# Patient Record
Sex: Male | Born: 1995 | Race: White | Hispanic: No | Marital: Single | Smoking: Former smoker
Health system: Southern US, Community
[De-identification: ages and names within clinical notes are randomized; demographics above are authoritative.]

---

## 2020-03-07 ENCOUNTER — Telehealth: Payer: Self-pay | Admitting: Psychiatry

## 2020-03-07 NOTE — Telephone Encounter (Signed)
Patients father called and stated that Travis Clay has been very depressed lately. He has an upcoming appt for 03/29/2020 but his father is concered about him and wants to know if he can get in this week. I let him know that Dr.Cottles's schedule is full for this week and that I would reach out to Dr. Jennelle Human to see if he recommends anything the patient can do until his upcoming appt. Travis Clay also states that they are keeping a close eye o him since his depression has gotten worse.

## 2020-03-29 ENCOUNTER — Ambulatory Visit (INDEPENDENT_AMBULATORY_CARE_PROVIDER_SITE_OTHER): Payer: Self-pay | Admitting: Psychiatry

## 2020-03-29 ENCOUNTER — Encounter: Payer: Self-pay | Admitting: Psychiatry

## 2020-03-29 ENCOUNTER — Other Ambulatory Visit: Payer: Self-pay

## 2020-03-29 ENCOUNTER — Encounter (INDEPENDENT_AMBULATORY_CARE_PROVIDER_SITE_OTHER): Payer: Self-pay

## 2020-03-29 DIAGNOSIS — F322 Major depressive disorder, single episode, severe without psychotic features: Secondary | ICD-10-CM

## 2020-03-29 MED ORDER — ARIPIPRAZOLE 5 MG PO TABS: 5.0000 mg | ORAL_TABLET | Freq: Every day | ORAL | 1 refills | Status: DC

## 2020-03-29 MED ORDER — PAROXETINE HCL 20 MG PO TABS: 20.0000 mg | ORAL_TABLET | Freq: Every day | ORAL | 1 refills | Status: DC

## 2020-03-29 MED ORDER — CLONAZEPAM 0.5 MG PO TABS
ORAL_TABLET | ORAL | 0 refills | Status: DC
Start: 2020-03-29 — End: 2020-04-15

## 2020-03-29 NOTE — Progress Notes (Signed)
[facility] MD/PA/NP Initial Note  03/29/2020 8:30 PM Travis Clay  MRN:  [id number]  Chief Complaint:   HPI: Seen with father Travis Clay "Travis Clay". Quit job in [employer] and then to [employer] job.  Couldn't sleep well obsessing over missing [place] job  Parents concerns:  Issues with boss in [place] and he was having anxiety and calling home over it.  Not feeling himself and having dark thoughts and depressed.  Was in such a crisis Dad went out there to see him.  Having problems with roommate there in [place] who was irresponsible.  Travis Clay wanted out of the situation.   Took job to [place].  But couldn't let it go about [place].  Ruminating and not sleeping.  Mother then went out to [place] concerned over his safety.   Came home 6 weeks ago and feels worse.  They're concerned he's anxious and depressed and shakey with tics.  Given him clonazepam occ for shaking.  He's flat and depressed and no joy.  Pt admits he's depressed and has had SI.  Worried over his future job insecurity bc  He's seen slow progression of decline.  Had misgivings about [place] bc maybe not a good  Place to be longterm.  Dog died.  Saw doctor and rx Lexapro 10.  Took it right before move to [place] and took it 2-3 mos.  Seemed to kill his drive.  Worried over warnings about SI. No benefit. Since coming to [place] no better worrying over future and feels regret over his decisions.  A lot of anger towards family bc being pushy about his decisions.   Seen Travis Clay saying he has had recent hair pulling from beard.   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  More "serious" SI recently.  Depressed and don't want to get out of bed in AM.   Only peace is watching TV.  EFA ruminating over his regrets with job.  Only couple of hours of sleep per night for a few weeks.   Starting to get scared of himself with the shakes and mild panic attacks and afraid to fly.   No enjoyment.  Difficulty concentrating.  Can't sit still.  Tired and anxious.    Been down before but never this bad.  No history of SI until [place]. No SA.  Started stuttering from loss of self confidence.   Used to be Education officer, community and outgoing.  Another doctor Rx paroxetine 10 mg for a week.  No SE noted. Melatonin and Benadryl without help.  No pot.  No history of drugs. Heavily using nicotine pouches seems to help some.  Beer or 2 every couple of days.  Visit Diagnosis:    ICD-10-CM   1. Severe major depression, single episode (HCC)  F32.2     Past Psychiatric History: As noted.   Lexapro 10 NR Paroxetine 10   Past Medical History: History reviewed. No pertinent past medical history. none  Family Psychiatric History: F his depression with positive response to Paxil and Wellbutrin B with istory of depression with treatment.  Family History: History reviewed. No pertinent family history.  Social History: Quit his last job.  He is single.  No children. Social History   Socioeconomic History  . Marital status: Single    Spouse name: Not on file  . Number of children: Not on file  . Years of education: Not on file  . Highest education level: Not on file  Occupational History  . Not  on file  Tobacco Use  . Smoking status: Not on file  Substance and Sexual Activity  . Alcohol use: Not on file  . Drug use: Not on file  . Sexual activity: Not on file  Other Topics Concern  . Not on file  Social History Narrative  . Not on file   Social Determinants of Health   Financial Resource Strain:   . Difficulty of Paying Living Expenses:   Food Insecurity:   . Worried About Charity fundraiser in the Last Year:   . Arboriculturist in the Last Year:   Transportation Needs:   . Film/video editor (Medical):   [name] Lack of Transportation (Non-Medical):   Physical Activity:   . Days of Exercise per Week:   . Minutes of Exercise per Session:   Stress:   . Feeling of Stress :   Social Connections:   . Frequency of Communication with Friends and  Family:   . Frequency of Social Gatherings with Friends and Family:   . Attends Religious Services:   . Active Member of Clubs or Organizations:   . Attends Archivist Meetings:   [name] Marital Status:     Allergies: Not on File  Metabolic Disorder Labs: No results found for: HGBA1C, MPG No results found for: PROLACTIN No results found for: CHOL, TRIG, HDL, CHOLHDL, VLDL, LDLCALC No results found for: TSH  Therapeutic Level Labs: No results found for: LITHIUM No results found for: VALPROATE No components found for:  CBMZ  Current Medications: Current Outpatient Medications  Medication Sig Dispense Refill  . PARoxetine (PAXIL) 10 MG tablet Take 10 mg by mouth daily.    . ARIPiprazole (ABILIFY) 5 MG tablet Take 1 tablet (5 mg total) by mouth daily. 30 tablet 1  . clonazePAM (KLONOPIN) 0.5 MG tablet 1-2 tablets at night for sleep and 1 twice daily as needed for anxiety 30 tablet 0  . PARoxetine (PAXIL) 20 MG tablet Take 1 tablet (20 mg total) by mouth daily. 30 tablet 1   No current facility-administered medications for this visit.    Medication Side Effects: none  Orders placed this visit:  No orders of the defined types were placed in this encounter.   Psychiatric Specialty Exam:  Review of Systems  Constitutional: Positive for fatigue. Negative for appetite change.  HENT: Negative for congestion, hearing loss, sinus pain, sore throat and tinnitus.   Eyes: Negative for visual disturbance.  Respiratory: Negative for chest tightness and shortness of breath.   Cardiovascular: Negative for chest pain and palpitations.  Gastrointestinal: Negative for abdominal distention, abdominal pain, constipation and diarrhea.  Endocrine: Negative for polyphagia.  Genitourinary: Negative for difficulty urinating, frequency and testicular pain.  Musculoskeletal: Negative for arthralgias, back pain and gait problem.  Skin: Negative for rash.  Allergic/Immunologic: Negative for  environmental allergies.  Neurological: Negative for dizziness, tremors and weakness.    There were no vitals taken for this visit.There is no height or weight on file to calculate BMI.  General Appearance: Casual  Eye Contact:  Good  Speech:  Normal Rate  Volume:  Normal  Mood:  Depressed  Affect:  Constricted and Depressed  Thought Process:  Coherent and Descriptions of Associations: Intact  Orientation:  Full (Time, Place, and Person)  Thought Content: rumination  Suicidal Thoughts:  Yes.  without intent/plan  Homicidal Thoughts:  No  Memory:  WNL  Judgement:  Fair  Insight:  Fair  Psychomotor Activity:  Normal  Concentration:  Concentration: Fair  Recall:  Good  Fund of Knowledge: Good  Language: Good  Assets:  Architect Housing Intimacy Leisure Time Physical Health Social Support Talents/Skills Transportation  ADL's:  Intact  Cognition: WNL  Prognosis:  Good   Screenings: MDQ negative  Receiving Psychotherapy: No   Treatment Plan/Recommendations: Patient has a severe episode of major depression with marked rumination.  This was discussed with him and his father in detail.  There is concern but because of the degree of suicidal thoughts.  Extensive amount of time was spent on this subject describing how depression can trigger suicidal thoughts and a sense of hopelessness that will resolve once the depression is under control.  Discussion around means to keep him safe.  He committed to safety.  Discussed with father to remove the firearms from the home to which he agreed.  Discussed the possibility of hospitalization for safety.  Discussed safety plan at length with patient.  Advised patient to contact office with any worsening signs and symptoms.  Instructed patient to go to the [facility] emergency room for evaluation if experiencing any acute safety concerns, to include suicidal intent. \ The current paroxetine dosage is  below the usual therapeutic range therefore increased to 10 mg daily.  We discussed the option of adding Abilify to potentiate the antidepressant in hopes of improving the odds of early response.  We discussed the risk of movement disorders etc. and he agreed to start Abilify 5 mg daily.  His insomnia is severe as a consequence of his depression.  Will prescribe clonazepam 0.5 mg tablets 1-2 nightly for sleep and as needed for severe anxiety which gets to the point of shaking at times  He will call if he worsens or does not tolerate the medications  Follow-up 2 weeks  Meredith Staggers MD, DFAPA     Lauraine Rinne, MD

## 2020-04-15 ENCOUNTER — Encounter: Payer: Self-pay | Admitting: Psychiatry

## 2020-04-15 ENCOUNTER — Ambulatory Visit (INDEPENDENT_AMBULATORY_CARE_PROVIDER_SITE_OTHER): Payer: Self-pay | Admitting: Psychiatry

## 2020-04-15 ENCOUNTER — Other Ambulatory Visit: Payer: Self-pay

## 2020-04-15 DIAGNOSIS — F322 Major depressive disorder, single episode, severe without psychotic features: Secondary | ICD-10-CM

## 2020-04-15 MED ORDER — PAROXETINE HCL 20 MG PO TABS: 20.0000 mg | ORAL_TABLET | Freq: Every day | ORAL | 0 refills | Status: DC

## 2020-04-15 MED ORDER — CLONAZEPAM 0.5 MG PO TABS: ORAL_TABLET | ORAL | 1 refills | Status: DC

## 2020-04-15 MED ORDER — ARIPIPRAZOLE 5 MG PO TABS: 5.0000 mg | ORAL_TABLET | Freq: Every day | ORAL | 0 refills | Status: DC

## 2020-04-15 NOTE — Patient Instructions (Signed)
N-Acetylcysteine at 600 mg 2 daily for hair pulling.

## 2020-04-15 NOTE — Progress Notes (Signed)
Travis Clay [id number] [birth year] 24 y.o.  Subjective:   Patient ID:  Travis Clay is a 24 y.o. (DOB 09-09-1996) male.  Chief Complaint:  Chief Complaint  Patient presents with  . Follow-up  . Depression  . Anxiety  . Sleeping Problem    HPI Travis Clay presents to the office today for follow-up of severe major depression with rumination and insomnia.  First seen 03/29/2020 and paroxetine was increased to 20 mg daily and Abilify 5 mg daily was added for potentiation because severity of symptoms.  Also clonazepam 0.51-2 nightly was given for sleep and severe anxiety.   04/15/2020 appointment with the following noted: Job offered last week.  Another one offered.  Dad thinks he's doing better.  Can still get anxious easily.  Sleeping is better using clonazepam without concerns 8 hours.  In general still feels lazy but nothing to do.  Maybe a little better but still feeling hopeless until the job offer.  Maybe some restlessness and anxiety still easily provoked with questions about the job. Hard to be alone bc gets restless.  Always wanted to be busy.  Working with father gets frustrating bc he is disorganized.  Occ fleeting SI but not severe at this time. Wonders about addiction risk of clonazepam.  Has not done much lately to require concentration.  Past Psychiatric History: As noted.   Lexapro 10 NR Paroxetine 10   Review of Systems:  Review of Systems  Neurological: Negative for tremors and weakness.    Medications: I have reviewed the patient's current medications.  Current Outpatient Medications  Medication Sig Dispense Refill  . ARIPiprazole (ABILIFY) 5 MG tablet Take 1 tablet (5 mg total) by mouth daily. 90 tablet 0  . clonazePAM (KLONOPIN) 0.5 MG tablet 1-2 tablets at night for sleep and 1 twice daily as needed for anxiety 30 tablet 1  . PARoxetine (PAXIL) 20 MG tablet Take 1 tablet (20 mg total) by mouth daily. 90 tablet 0   No current  facility-administered medications for this visit.    Medication Side Effects: None  Allergies: Not on File  History reviewed. No pertinent past medical history.  History reviewed. No pertinent family history.  Social History   Socioeconomic History  . Marital status: Single    Spouse name: Not on file  . Number of children: Not on file  . Years of education: Not on file  . Highest education level: Not on file  Occupational History  . Not on file  Tobacco Use  . Smoking status: Not on file  Substance and Sexual Activity  . Alcohol use: Not on file  . Drug use: Not on file  . Sexual activity: Not on file  Other Topics Concern  . Not on file  Social History Narrative  . Not on file   Social Determinants of Health   Financial Resource Strain:   . Difficulty of Paying Living Expenses:   Food Insecurity:   . Worried About Programme researcher, broadcasting/film/video in the Last Year:   . Barista in the Last Year:   Transportation Needs:   . Freight forwarder (Medical):   [name] Lack of Transportation (Non-Medical):   Physical Activity:   . Days of Exercise per Week:   . Minutes of Exercise per Session:   Stress:   . Feeling of Stress :   Social Connections:   . Frequency of Communication with Friends and Family:   . Frequency of Social Gatherings with  Friends and Family:   . Attends Religious Services:   . Active Member of Clubs or Organizations:   . Attends Banker Meetings:   [name] Marital Status:   Intimate Partner Violence:   . Fear of Current or Ex-Partner:   . Emotionally Abused:   [name] Physically Abused:   . Sexually Abused:     Past Medical History, Surgical history, Social history, and Family history were reviewed and updated as appropriate.   Please see review of systems for further details on the patient's review from today.   Objective:   Physical Exam:  There were no vitals taken for this visit.  Physical Exam Constitutional:      General: He is not  in acute distress. Musculoskeletal:        General: No deformity.  Neurological:     Mental Status: He is alert and oriented to person, place, and time.     Coordination: Coordination normal.  Psychiatric:        Attention and Perception: Attention and perception normal. He does not perceive auditory or visual hallucinations.        Mood and Affect: Mood is anxious and depressed. Affect is not labile, blunt, angry or inappropriate.        Speech: Speech normal.        Behavior: Behavior normal.        Thought Content: Thought content normal. Thought content is not paranoid or delusional. Thought content does not include homicidal or suicidal ideation. Thought content does not include homicidal or suicidal plan.        Cognition and Memory: Cognition and memory normal.        Judgment: Judgment normal.     Comments: Insight intact Still some rumination.  Not as severe and less desperate.     Lab Review:  No results found for: NA, K, CL, CO2, GLUCOSE, BUN, CREATININE, CALCIUM, PROT, ALBUMIN, AST, ALT, ALKPHOS, BILITOT, GFRNONAA, GFRAA  No results found for: WBC, RBC, HGB, HCT, PLT, MCV, MCH, MCHC, RDW, LYMPHSABS, MONOABS, EOSABS, BASOSABS  No results found for: POCLITH, LITHIUM   No results found for: PHENYTOIN, PHENOBARB, VALPROATE, CBMZ   .res Assessment: Plan:    Damion was seen today for follow-up, depression, anxiety and sleeping problem.  Diagnoses and all orders for this visit:  Severe major depression, single episode (HCC) -     PARoxetine (PAXIL) 20 MG tablet; Take 1 tablet (20 mg total) by mouth daily. -     ARIPiprazole (ABILIFY) 5 MG tablet; Take 1 tablet (5 mg total) by mouth daily. -     clonazePAM (KLONOPIN) 0.5 MG tablet; 1-2 tablets at night for sleep and 1 twice daily as needed for anxiety    Patient has had a severe episode of major depression with marked rumination.  He has a strong family history of depression.  The depression has interfered with his  ability to work. He has been on paroxetine 20 mg since 03/30/2020 with Abilify 5 mg as a potentiating agent.  He is tolerating the me ds well. He is sleeping better on clonazepam 0.5 mg nightly.  Overall he is moderately improved at a pace that would be expected based on the duration of the current meds.  He still is depressed but is less hopeless and not significantly suicidal any longer.  He was offered a job today which was of great relief.  We discussed the time course to response with antidepressants for several weeks and it will  be 2 or 3 weeks more before he has full response assuming current dosages are correct.  He understands there may not be and we may have to increase the dose.  Emphasized the need for compliance and did not discontinue medications on his own.  We discussed the treatment plan at length including duration of each of the individual medications.  We discussed the possibility of weaning off the clonazepam once he is established in his sleep pattern and how to do so by reducing the dose for 1 to 2 weeks before discontinuing.  We discussed the addiction potential of clonazepam.  He will continue the Abilify until his symptoms are in remission for approximately 3 months and then we will attempt to discontinue it.  He will need to continue the paroxetine for minimum of 6 to 9 months but given family history potentially longer.  That is to be determined later.  Discussed SSRI withdrawal.  Discussed antipsychotic side effects.  He agrees to this plan.  Follow-up 3 to 4 weeks  Lynder Parents MD, DFAPA Please see After Visit Summary for patient specific instructions.  No future appointments.  No orders of the defined types were placed in this encounter.   -------------------------------

## 2020-07-11 ENCOUNTER — Encounter: Payer: Self-pay | Admitting: Psychiatry

## 2020-07-11 ENCOUNTER — Telehealth (INDEPENDENT_AMBULATORY_CARE_PROVIDER_SITE_OTHER): Payer: Self-pay | Admitting: Psychiatry

## 2020-07-11 DIAGNOSIS — F322 Major depressive disorder, single episode, severe without psychotic features: Secondary | ICD-10-CM

## 2020-07-11 MED ORDER — CLONAZEPAM 0.5 MG PO TABS: ORAL_TABLET | ORAL | 1 refills | Status: DC

## 2020-07-11 MED ORDER — PAROXETINE HCL 20 MG PO TABS: 20.0000 mg | ORAL_TABLET | Freq: Every day | ORAL | 1 refills | Status: DC

## 2020-07-11 MED ORDER — ARIPIPRAZOLE 5 MG PO TABS: 5.0000 mg | ORAL_TABLET | Freq: Every day | ORAL | 1 refills | Status: DC

## 2020-07-11 NOTE — Progress Notes (Signed)
Travis Clay [id number] 16-Nov-1995 24 y.o.  Virtual Visit via Telephone Note  I connected with pt by telephone and verified that I am speaking with the correct person using two identifiers.   I discussed the limitations, risks, security and privacy concerns of performing an evaluation and management service by telephone and the availability of in person appointments. I also discussed with the patient that there may be a patient responsible charge related to this service. The patient expressed understanding and agreed to proceed.  I discussed the assessment and treatment plan with the patient. The patient was provided an opportunity to ask questions and all were answered. The patient agreed with the plan and demonstrated an understanding of the instructions.   The patient was advised to call back or seek an in-person evaluation if the symptoms worsen or if the condition fails to improve as anticipated.  I provided 30 minutes of non-face-to-face time during this encounter. The call started at 115 and ended at 145. The patient was located at work and the provider was located office.  Subjective:   Patient ID:  Travis Clay is a 24 y.o. (DOB [birth year]) male.  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  First seen 03/29/2020 and paroxetine was increased to 20 mg daily and Abilify 5 mg daily was added for potentiation because severity of symptoms.  Also clonazepam 0.51-2 nightly was given for sleep and severe anxiety.   04/15/2020 appointment with the following noted: Job offered last week.  Another one offered.  Dad thinks he's doing better.  Can still get anxious easily.  Sleeping is better using clonazepam without concerns 8 hours.  In general still feels lazy but nothing to do.  Maybe a little better but  still feeling hopeless until the job offer.  Maybe some restlessness and anxiety still easily provoked with questions about the job. Hard to be alone bc gets restless.  Always wanted to be busy.  Working with father gets frustrating bc he is disorganized.  Occ fleeting SI but not severe at this time. Wonders about addiction risk of clonazepam.  Has not done much lately to require concentration.  07/11/20 appt with the following noted: Started new job 2 mos ago in [place].  More stressful than expected.  Some trouble staying focused.  Grades himself B or C.  Boss thinks it's OK.  A lot of travel. Stopped all meds paroxetine, Abilify, clonazepam a couple of months ago. He thinks he's worse but not sure of the cause.  On weekends don't want to get OOB and drinking more.  Using a lot of nicotine.  No missed work.  3-4 hours sleep bc hard to go to sleep and EMA.   He stopped bc didn't think he needed the meds.  Doesn't really want to be on meds but life is hard on him.  Scared to see people he knew before.  Hard to communicate with people.  Quit taking care of himself.  No SI but thoughts of death.  Past Psychiatric History: As noted.   Lexapro 10 NR Paroxetine 10   Review of Systems:  Review of Systems  Gastrointestinal: Positive for constipation.  Neurological: Negative for tremors and weakness.  Psychiatric/Behavioral: Positive for dysphoric mood. The patient is nervous/anxious.     Medications: I have reviewed  the patient's current medications.  Current Outpatient Medications  Medication Sig Dispense Refill   ARIPiprazole (ABILIFY) 5 MG tablet Take 1 tablet (5 mg total) by mouth daily. 30 tablet 1   clonazePAM (KLONOPIN) 0.5 MG tablet 1-2 tablets at night for sleep and 1 twice daily as needed for anxiety 30 tablet 1   PARoxetine (PAXIL) 20 MG tablet Take 1 tablet (20 mg total) by mouth daily. 30 tablet 1   No current facility-administered medications for this visit.    Medication Side  Effects: None  Allergies: Not on File  History reviewed. No pertinent past medical history.  History reviewed. No pertinent family history.  Social History   Socioeconomic History   Marital status: Single    Spouse name: Not on file   Number of children: Not on file   Years of education: Not on file   Highest education level: Not on file  Occupational History   Not on file  Tobacco Use   Smoking status: Former Smoker   Smokeless tobacco: Current User  Substance and Sexual Activity   Alcohol use: Not on file   Drug use: Not on file   Sexual activity: Not on file  Other Topics Concern   Not on file  Social History Narrative   Not on file   Social Determinants of Health   Financial Resource Strain:    Difficulty of Paying Living Expenses: Not on file  Food Insecurity:    Worried About Running Out of Food in the Last Year: Not on file   Ran Out of Food in the Last Year: Not on file  Transportation Needs:    Lack of Transportation (Medical): Not on file   Lack of Transportation (Non-Medical): Not on file  Physical Activity:    Days of Exercise per Week: Not on file   Minutes of Exercise per Session: Not on file  Stress:    Feeling of Stress : Not on file  Social Connections:    Frequency of Communication with Friends and Family: Not on file   Frequency of Social Gatherings with Friends and Family: Not on file   Attends Religious Services: Not on file   Active Member of Clubs or Organizations: Not on file   Attends Banker Meetings: Not on file   Marital Status: Not on file  Intimate Partner Violence:    Fear of Current or Ex-Partner: Not on file   Emotionally Abused: Not on file   Physically Abused: Not on file   Sexually Abused: Not on file    Past Medical History, Surgical history, Social history, and Family history were reviewed and updated as appropriate.   Please see review of systems for further details on the  patient's review from today.   Objective:   Physical Exam:  There were no vitals taken for this visit.  Physical Exam Neurological:     Mental Status: He is alert and oriented to person, place, and time.     Cranial Nerves: No dysarthria.  Psychiatric:        Attention and Perception: Attention and perception normal.        Mood and Affect: Mood is anxious and depressed.        Speech: Speech normal.        Behavior: Behavior is cooperative.        Thought Content: Thought content normal. Thought content is not paranoid or delusional. Thought content does not include homicidal or suicidal ideation. Thought content does not  include homicidal or suicidal plan.        Cognition and Memory: Cognition and memory normal.     Comments: Insight impaired. Ruminative again.     Lab Review:  No results found for: NA, K, CL, CO2, GLUCOSE, BUN, CREATININE, CALCIUM, PROT, ALBUMIN, AST, ALT, ALKPHOS, BILITOT, GFRNONAA, GFRAA  No results found for: WBC, RBC, HGB, HCT, PLT, MCV, MCH, MCHC, RDW, LYMPHSABS, MONOABS, EOSABS, BASOSABS  No results found for: POCLITH, LITHIUM   No results found for: PHENYTOIN, PHENOBARB, VALPROATE, CBMZ   .res Assessment: Plan:    Obe was seen today for anxiety, depression and follow-up.  Diagnoses and all orders for this visit:  Severe major depression, single episode (HCC) -     ARIPiprazole (ABILIFY) 5 MG tablet; Take 1 tablet (5 mg total) by mouth daily. -     PARoxetine (PAXIL) 20 MG tablet; Take 1 tablet (20 mg total) by mouth daily. -     clonazePAM (KLONOPIN) 0.5 MG tablet; 1-2 tablets at night for sleep and 1 twice daily as needed for anxiety    Patient has had a severe episode of major depression with marked rumination.  He has a strong family history of depression.  The depression has interfered with his ability to work. He had been on paroxetine 20 mg for about a month starting 03/30/2020 with Abilify 5 mg as a potentiating agent.   Overall he  was moderately improved at a pace that would be expected based on the duration of the current meds.    Unfortunately, AMA he stopped all the medications shortly after his last visit April 15, 2020.  He has had a relapse.  He has become ruminative again and depressed and not caring for himself adequately with passive death thoughts with no active suicidal intent or plan.  We discussed the time course to response with antidepressants for several weeks and he agrees to restart paroxetine 20 mg daily, aripiprazole 5 mg daily and clonazepam 0.5 to 1 mg nightly to address his sleep problem.  He was again encouraged not to drink with the clonazepam..  He understands there may not be and we may have to increase the dose.  Emphasized the need for compliance and did not discontinue medications on his own.  Unfortunately his relapses because of his noncompliance most likely and this was discussed.  We discussed the treatment plan at length including duration of each of the individual medications.  We discussed the possibility of weaning off the clonazepam once he is established in his sleep pattern and how to do so by reducing the dose for 1 to 2 weeks before discontinuing.  We discussed the addiction potential of clonazepam.  He will continue the Abilify until his symptoms are in remission for approximately 3 months and then we will attempt to discontinue it.  He will need to continue the paroxetine for minimum of 6 to 9 months but given family history potentially longer.  That is to be determined later.  Discussed SSRI withdrawal.  Discussed antipsychotic side effects.  He was instructed to call the office if for any reasons he finds himself unable to tolerate the medicine.  He agrees to this plan.  Follow-up 3 to 4 weeks  Meredith Staggers MD, DFAPA Please see After Visit Summary for patient specific instructions.  No future appointments.  No orders of the defined types were placed in this  encounter.   -------------------------------

## 2020-08-05 ENCOUNTER — Telehealth (INDEPENDENT_AMBULATORY_CARE_PROVIDER_SITE_OTHER): Payer: BC Managed Care – PPO | Admitting: Psychiatry

## 2020-08-05 ENCOUNTER — Encounter: Payer: Self-pay | Admitting: Psychiatry

## 2020-08-05 ENCOUNTER — Telehealth: Payer: Self-pay | Admitting: Psychiatry

## 2020-08-05 DIAGNOSIS — F322 Major depressive disorder, single episode, severe without psychotic features: Secondary | ICD-10-CM | POA: Diagnosis not present

## 2020-08-05 DIAGNOSIS — R4689 Other symptoms and signs involving appearance and behavior: Secondary | ICD-10-CM

## 2020-08-05 NOTE — Progress Notes (Signed)
Travis Clay [id number] July 03, 1996 24 y.o.  Virtual Visit via Telephone Note  I connected with pt by telephone and verified that I am speaking with the correct person using two identifiers.   I discussed the limitations, risks, security and privacy concerns of performing an evaluation and management service by telephone and the availability of in person appointments. I also discussed with the patient that there may be a patient responsible charge related to this service. The patient expressed understanding and agreed to proceed.  I discussed the assessment and treatment plan with the patient. The patient was provided an opportunity to ask questions and all were answered. The patient agreed with the plan and demonstrated an understanding of the instructions.   The patient was advised to call back or seek an in-person evaluation if the symptoms worsen or if the condition fails to improve as anticipated.  I provided 30 minutes of non-face-to-face time during this encounter. The call started at 915 and ended at 945. The patient was located at work and the provider was located office.  Subjective:   Patient ID:  Travis Clay is a 24 y.o. (DOB [birth year]) male.  Chief Complaint:  Chief Complaint  Patient presents with  . Follow-up  . Depression  . 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  First seen 03/29/2020 and paroxetine was increased to 20 mg daily and Abilify 5 mg daily was added for potentiation because severity of symptoms.  Also clonazepam 0.51-2 nightly was given for sleep and severe anxiety.   04/15/2020 appointment with the following noted: Job offered last week.  Another one offered.  Dad thinks he's doing better.  Can still get anxious easily.  Sleeping is better using clonazepam without concerns 8 hours.  In general still feels lazy but nothing to do.  Maybe a little better but  still feeling hopeless until the job offer.  Maybe some restlessness and anxiety still easily provoked with questions about the job. Hard to be alone bc gets restless.  Always wanted to be busy.  Working with father gets frustrating bc he is disorganized.  Occ fleeting SI but not severe at this time. Wonders about addiction risk of clonazepam.  Has not done much lately to require concentration.  07/11/20 appt with the following noted: Started new job 2 mos ago in [place].  More stressful than expected.  Some trouble staying focused.  Grades himself B or C.  Boss thinks it's OK.  A lot of travel. Stopped all meds paroxetine, Abilify, clonazepam a couple of months ago. He thinks he's worse but not sure of the cause.  On weekends don't want to get OOB and drinking more.  Using a lot of nicotine.  No missed work.  3-4 hours sleep bc hard to go to sleep and EMA.   He stopped bc didn't think he needed the meds.  Doesn't really want to be on meds but life is hard on him.  Scared to see people he knew before.  Hard to communicate with people.  Quit taking care of himself.  No SI but thoughts of death.  08/28/2020 appt with following noted: Things don't seem better.  Ruminating on making mistakes with past jobs.  Still depressed.  Might say he wishes he was dead while walking in the [place].  Commits to keeping himself safe though has SI often.  Occ problems sleeping even with the meds.  [name]  Missing meds half the time.   2 beers last night. Average 2-3 beers not every night. Doubts that medicine can fix things that he feels poor decisions have caused.  Repeatedly Back came back to that same.  Past Psychiatric History: As noted.   Lexapro 10 NR Paroxetine 10   Review of Systems:  Review of Systems  Cardiovascular: Negative for chest pain and palpitations.  Gastrointestinal: Positive for constipation.  Neurological: Negative for tremors and weakness.  Psychiatric/Behavioral: Positive for dysphoric mood. The  patient is nervous/anxious.     Medications: I have reviewed the patient's current medications.  Current Outpatient Medications  Medication Sig Dispense Refill  . ARIPiprazole (ABILIFY) 5 MG tablet Take 1 tablet (5 mg total) by mouth daily. 30 tablet 1  . clonazePAM (KLONOPIN) 0.5 MG tablet 1-2 tablets at night for sleep and 1 twice daily as needed for anxiety 30 tablet 1  . PARoxetine (PAXIL) 20 MG tablet Take 1 tablet (20 mg total) by mouth daily. 30 tablet 1   No current facility-administered medications for this visit.    Medication Side Effects: None  Allergies: Not on File  History reviewed. No pertinent past medical history.  History reviewed. No pertinent family history.  Social History   Socioeconomic History  . Marital status: Single    Spouse name: Not on file  . Number of children: Not on file  . Years of education: Not on file  . Highest education level: Not on file  Occupational History  . Not on file  Tobacco Use  . Smoking status: Former Games developermoker  . Smokeless tobacco: Current User  Substance and Sexual Activity  . Alcohol use: Not on file  . Drug use: Not on file  . Sexual activity: Not on file  Other Topics Concern  . Not on file  Social History Narrative  . Not on file   Social Determinants of Health   Financial Resource Strain:   . Difficulty of Paying Living Expenses: Not on file  Food Insecurity:   . Worried About Programme researcher, broadcasting/film/videounning Out of Food in the Last Year: Not on file  . Ran Out of Food in the Last Year: Not on file  Transportation Needs:   . Lack of Transportation (Medical): Not on file  . Lack of Transportation (Non-Medical): Not on file  Physical Activity:   . Days of Exercise per Week: Not on file  . Minutes of Exercise per Session: Not on file  Stress:   . Feeling of Stress : Not on file  Social Connections:   . Frequency of Communication with Friends and Family: Not on file  . Frequency of Social Gatherings with Friends and Family: Not  on file  . Attends Religious Services: Not on file  . Active Member of Clubs or Organizations: Not on file  . Attends BankerClub or Organization Meetings: Not on file  . Marital Status: Not on file  Intimate Partner Violence:   . Fear of Current or Ex-Partner: Not on file  . Emotionally Abused: Not on file  . Physically Abused: Not on file  . Sexually Abused: Not on file    Past Medical History, Surgical history, Social history, and Family history were reviewed and updated as appropriate.   Please see review of systems for further details on the patient's review from today.   Objective:   Physical Exam:  There were no vitals taken for this visit.  Physical Exam Neurological:     Mental Status: He is alert and  oriented to person, place, and time.     Cranial Nerves: No dysarthria.  Psychiatric:        Attention and Perception: Attention and perception normal.        Mood and Affect: Mood is anxious and depressed. Affect is not tearful.        Speech: Speech normal.        Behavior: Behavior is cooperative.        Thought Content: Thought content normal. Thought content is not paranoid or delusional. Thought content does not include homicidal or suicidal ideation. Thought content does not include homicidal or suicidal plan.        Cognition and Memory: Cognition and memory normal.     Comments: Insight impaired. Ruminative persists.   Noncompliant     Lab Review:  No results found for: NA, K, CL, CO2, GLUCOSE, BUN, CREATININE, CALCIUM, PROT, ALBUMIN, AST, ALT, ALKPHOS, BILITOT, GFRNONAA, GFRAA  No results found for: WBC, RBC, HGB, HCT, PLT, MCV, MCH, MCHC, RDW, LYMPHSABS, MONOABS, EOSABS, BASOSABS  No results found for: POCLITH, LITHIUM   No results found for: PHENYTOIN, PHENOBARB, VALPROATE, CBMZ   .res Assessment: Plan:    Vyom was seen today for follow-up, depression and anxiety.  Diagnoses and all orders for this visit:  Severe major depression, single episode  (HCC)  Non-compliant behavior    Patient has had a severe episode of major depression with marked rumination.  He has a strong family history of depression.  The depression has interfered with his ability to work. He had been on paroxetine 20 mg for about a month starting 03/30/2020 with Abilify 5 mg as a potentiating agent.   Overall he was moderately improved at a pace that would be expected based on the duration of the current meds.    Unfortunately, AMA he stopped all the medications shortly after his last visit April 15, 2020.  He has had a relapse.  He has become ruminative again and depressed and not caring for himself adequately with passive death thoughts with no active suicidal intent or plan.  Not better bc noncompliant.  We discussed the time course to response with antidepressants for several weeks and he agrees to restart paroxetine 20 mg daily, aripiprazole 5 mg daily and clonazepam 0.5 to 1 mg nightly to address his sleep problem.  He was again encouraged not to drink with the clonazepam..  He understands there may not be and we may have to increase the dose.  Emphasized the need for compliance and did not discontinue medications on his own.  Unfortunately his relapses because of his noncompliance most likely and this was discussed.  We discussed the treatment plan at length including duration of each of the individual medications.  We discussed the possibility of weaning off the clonazepam once he is established in his sleep pattern and how to do so by reducing the dose for 1 to 2 weeks before discontinuing.  We discussed the addiction potential of clonazepam.  He will continue the Abilify until his symptoms are in remission for approximately 3 months and then we will attempt to discontinue it.  At this session he was very ambivalent about taking the medication and would not clearly commit to consistency but states that he is taking it at the moment.  Confronted his rumination as a  symptom of depression as an effort to try to help him understand the consequences of untreated depression.  His insight into this was limited because he kept returning  to ruminative thought.  He was instructed to call the office if for any reasons he finds himself unable to tolerate the medicine.  He will not commit to the meds.    Very concerned about this and his future safety bc daily SI.  Informed him of need to contact his family to seek to elicit their help to convince him to get help.  Called mother and asked her to contact me regarding his mental health status. Mother returned call and we spoke at length.  She states the current job is in is in fact a "crap job" and when he just took just to get out of town because he thought it would help.  In view of his difficulty with compliance and ambivalence about taking the medication and persistent suicidal thoughts it was agreed it would be ideal if he came back to [place] and left his current job.  That way the family can ensure compliance with medication because they were able to do that previous to him moving to [place].  It is highly likely that that will greatly improve his depression symptoms because it did in the past.  In addition he could resume his therapy be here with his current therapist [name] and work on cognitive behavior therapy.  Patient does acknowledge that he needs that.  Discussed ways and techniques to convince patient to return to [place] with him.  Rec therapy.  He agrees to call [name] and pursue therapy.  Left message with his therapist regarding the patient's current status.  Follow-up 3 to 4 weeks  Meredith Staggers MD, DFAPA Please see After Visit Summary for patient specific instructions.  No future appointments.  No orders of the defined types were placed in this encounter.   -------------------------------

## 2020-08-05 NOTE — Telephone Encounter (Signed)
Aram Beecham wants Dr. Jennelle Human to call her husband, Si Jachim, to discuss her son.Call [phone]

## 2020-08-07 NOTE — Telephone Encounter (Signed)
08/05/20  TC father Rusty bc of concerns over pt's safety  Disc pt ruminative, noncompliant with meds, willing to go to therapy but voiced SI daily with desire to be dead without intent, plan currently. BC of above and concerns pt will not improve until he is compliant with tx plan rec they go get pt and bring him back to [place] for supervised tx under their observation.  Father and mother agree.  Meredith Staggers, MD, DFAPA

## 2020-11-08 ENCOUNTER — Other Ambulatory Visit: Payer: Self-pay | Admitting: Psychiatry

## 2020-11-08 DIAGNOSIS — F322 Major depressive disorder, single episode, severe without psychotic features: Secondary | ICD-10-CM

## 2020-11-21 ENCOUNTER — Telehealth (INDEPENDENT_AMBULATORY_CARE_PROVIDER_SITE_OTHER): Payer: BC Managed Care – PPO | Admitting: Psychiatry

## 2020-11-21 ENCOUNTER — Encounter: Payer: Self-pay | Admitting: Psychiatry

## 2020-11-21 DIAGNOSIS — F322 Major depressive disorder, single episode, severe without psychotic features: Secondary | ICD-10-CM

## 2020-11-21 DIAGNOSIS — R4689 Other symptoms and signs involving appearance and behavior: Secondary | ICD-10-CM

## 2020-11-21 MED ORDER — ARIPIPRAZOLE 5 MG PO TABS: 5.0000 mg | ORAL_TABLET | Freq: Every day | ORAL | 0 refills | Status: DC

## 2020-11-21 MED ORDER — PAROXETINE HCL 20 MG PO TABS
20.0000 mg | ORAL_TABLET | Freq: Every day | ORAL | 0 refills | Status: DC
Start: 2020-11-21 — End: 2023-04-02

## 2020-11-21 NOTE — Progress Notes (Signed)
[name] [id number] [birth year] 25 y.o.  Virtual Visit via Telephone Note  I connected with pt by telephone and verified that I am speaking with the correct person using two identifiers.   I discussed the limitations, risks, security and privacy concerns of performing an evaluation and management service by telephone and the availability of in person appointments. I also discussed with the patient that there may be a patient responsible charge related to this service. The patient expressed understanding and agreed to proceed.  I discussed the assessment and treatment plan with the patient. The patient was provided an opportunity to ask questions and all were answered. The patient agreed with the plan and demonstrated an understanding of the instructions.   The patient was advised to call back or seek an in-person evaluation if the symptoms worsen or if the condition fails to improve as anticipated.  I provided 30 minutes of non-face-to-face time during this encounter. The call started at 1115 and ended at 1145. The patient was located at work and the provider was located office.  Subjective:   Patient ID:  Travis Clay is a 25 y.o. (DOB [birth year]) male.  Chief Complaint:  Chief Complaint  Patient presents with  . Follow-up  . Depression  . Anxiety    HPI Travis Clay presents to the office today for follow-up of severe major depression with rumination and insomnia.  First seen 03/29/2020 and paroxetine was increased to 20 mg daily and Abilify 5 mg daily was added for potentiation because severity of symptoms.  Also clonazepam 0.51-2 nightly was given for sleep and severe anxiety.   04/15/2020 appointment with the following noted: Job offered last week.  Another one offered.  Dad thinks he's doing better.  Can still get anxious easily.  Sleeping is better using clonazepam without concerns 8 hours.  In general still feels lazy but nothing to do.  Maybe a little better  but still feeling hopeless until the job offer.  Maybe some restlessness and anxiety still easily provoked with questions about the job. Hard to be alone bc gets restless.  Always wanted to be busy.  Working with father gets frustrating bc he is disorganized.  Occ fleeting SI but not severe at this time. Wonders about addiction risk of clonazepam.  Has not done much lately to require concentration.  07/11/20 appt with the following noted: Started new job 2 mos ago in [place].  More stressful than expected.  Some trouble staying focused.  Grades himself B or C.  Boss thinks it's OK.  A lot of travel. Stopped all meds paroxetine, Abilify, clonazepam a couple of months ago. He thinks he's worse but not sure of the cause.  On weekends don't want to get OOB and drinking more.  Using a lot of nicotine.  No missed work.  3-4 hours sleep bc hard to go to sleep and EMA.   He stopped bc didn't think he needed the meds.  Doesn't really want to be on meds but life is hard on him.  Scared to see people he knew before.  Hard to communicate with people.  Quit taking care of himself.  No SI but thoughts of death.  August 06, 2020 appt with following noted: Things don't seem better.  Ruminating on making mistakes with past jobs.  Still depressed.  Might say he wishes he was dead while walking in the forest.  Commits to keeping himself safe though has SI often.  Occ problems sleeping even with the meds.  Travis Clay  Missing meds half the time.   08/05/20 TC father Rusty bc of concerns over pt's safety Disc pt ruminative, noncompliant with meds, willing to go to therapy but voiced SI daily with desire to be dead without intent, plan currently. BC of above and concerns pt will not improve until he is compliant with tx plan rec they go get pt and bring him back to [place] for supervised tx under their observation.  Father and mother agree. Meredith Staggers, MD, [facility]  11/21/2020 appointment with the following noted: In [place] in [place].  Moved back  from [place].  Got job in [employer] for [employer] and started last week. Taking paroxetine 20 and Abilify 5 consistently since here. Not great just at home but feels better back at work.  More hopeful some.  Still seems pretty hard. Sex SE a little without others. No clonazepam.   Sleep is better lately.  OK.  Some nights hard to fall asleep.   No SI lately.   Still a little off.    2 beers last night. Average 2-3 beers not every night. Doubts that medicine can fix things that he feels poor decisions have caused.  Repeatedly Back came back to that same.  Past Psychiatric History: As noted.   Lexapro 10 NR Paroxetine 20  Abilify 5  Review of Systems:  Review of Systems  Cardiovascular: Negative for chest pain and palpitations.  Gastrointestinal: Negative for constipation.  Neurological: Negative for tremors and weakness.  Psychiatric/Behavioral: Positive for dysphoric mood. The patient is nervous/anxious.     Medications: I have reviewed the patient's current medications.  Current Outpatient Medications  Medication Sig Dispense Refill  . ARIPiprazole (ABILIFY) 5 MG tablet Take 1 tablet (5 mg total) by mouth daily. 90 tablet 0  . clonazePAM (KLONOPIN) 0.5 MG tablet 1-2 tablets at night for sleep and 1 twice daily as needed for anxiety (Patient not taking: Reported on 11/21/2020) 30 tablet 1  . PARoxetine (PAXIL) 20 MG tablet Take 1 tablet (20 mg total) by mouth daily. 90 tablet 0   No current facility-administered medications for this visit.    Medication Side Effects: None  Allergies: Not on File  History reviewed. No pertinent past medical history.  History reviewed. No pertinent family history.  Social History   Socioeconomic History  . Marital status: Single    Spouse name: Not on file  . Number of children: Not on file  . Years of education: Not on file  . Highest education level: Not on file  Occupational History  . Not on file  Tobacco Use  . Smoking status:  Former Games developer  . Smokeless tobacco: Current User  Substance and Sexual Activity  . Alcohol use: Not on file  . Drug use: Not on file  . Sexual activity: Not on file  Other Topics Concern  . Not on file  Social History Narrative  . Not on file   Social Determinants of Health   Financial Resource Strain: Not on file  Food Insecurity: Not on file  Transportation Needs: Not on file  Physical Activity: Not on file  Stress: Not on file  Social Connections: Not on file  Intimate Partner Violence: Not on file    Past Medical History, Surgical history, Social history, and Family history were reviewed and updated as appropriate.   Please see review of systems for further details on the patient's review from today.   Objective:   Physical Exam:  There were no vitals taken for this  visit.  Physical Exam Neurological:     Mental Status: He is alert and oriented to person, place, and time.     Cranial Nerves: No dysarthria.  Psychiatric:        Attention and Perception: Attention and perception normal.        Mood and Affect: Mood is depressed. Mood is not anxious. Affect is not tearful.        Speech: Speech normal.        Behavior: Behavior is cooperative.        Thought Content: Thought content normal. Thought content is not paranoid or delusional. Thought content does not include homicidal or suicidal ideation. Thought content does not include homicidal or suicidal plan.        Cognition and Memory: Cognition and memory normal.     Comments: Insight better Better compliant Depression mild     Lab Review:  No results found for: NA, K, CL, CO2, GLUCOSE, BUN, CREATININE, CALCIUM, PROT, ALBUMIN, AST, ALT, ALKPHOS, BILITOT, GFRNONAA, GFRAA  No results found for: WBC, RBC, HGB, HCT, PLT, MCV, MCH, MCHC, RDW, LYMPHSABS, MONOABS, EOSABS, BASOSABS  No results found for: POCLITH, LITHIUM   No results found for: PHENYTOIN, PHENOBARB, VALPROATE, CBMZ   .res Assessment: Plan:     Haakon was seen today for follow-up, depression and anxiety.  Diagnoses and all orders for this visit:  Severe major depression, single episode (HCC) -     ARIPiprazole (ABILIFY) 5 MG tablet; Take 1 tablet (5 mg total) by mouth daily. -     PARoxetine (PAXIL) 20 MG tablet; Take 1 tablet (20 mg total) by mouth daily.  Non-compliant behavior    Patient  had a severe episode of major depression with marked rumination.  He has a strong family history of depression.  The depression has interfered with his ability to work. He had been on paroxetine 20 mg for about a month starting 03/30/2020 with Abilify 5 mg as a potentiating agent.   Overall he was moderately improved at a pace that would be expected based on the duration of the current meds.   Unfortunately, AMA he stopped all the medications shortly after his last visit April 15, 2020.  He  had a relapse.  He had become ruminative again and depressed and not caring for himself adequately with passive death thoughts with no active suicidal intent or plan.  Not better bc noncompliant.  His parents called after the October 2021 appointment stating it had suicidal thoughts that were greater than what he had communicated to the MD.  It was agreed between the parents and the MD that they go get him and bring him back home in an effort to try to get him help and have a more protected and supportive environment. He came back to [place] and did restart the paroxetine 20 mg daily with Abilify 5 mg daily and states he has been compliant.  He is much improved.  He no longer has suicidal thoughts and is no longer ruminative.  He has been able to find another job which he started mid January 2022. His depression has not resolved but it is markedly improved.  It is hoped that returning to some normalcy including living on his own and working in his desired field will help him recover the rest of the way.  We will continue paroxetine 20 mg daily with  aripiprazole 5 mg daily without changing dosages.  He has some mild sexual side effects so we  will not increase the paroxetine.  We will follow-up in 8 weeks to ensure he gets the rest of the way there in terms of recovery.  If he does not get recovered from the depression then we should consider further med adjustments.  Meredith Staggers MD, [facility] Please see After Visit Summary for patient specific instructions.  No future appointments.  No orders of the defined types were placed in this encounter.   -------------------------------

## 2020-11-25 ENCOUNTER — Telehealth: Payer: Self-pay | Admitting: Psychiatry

## 2020-11-25 NOTE — Telephone Encounter (Signed)
Abilify is cheap with goodrx.  Needs to pick a pharmacy in his area of [place].  Have him look on goodrx site.  Don't stop Abilify and let us knowif there is a problem

## 2020-11-25 NOTE — Telephone Encounter (Signed)
Will contact patient's pharmacy to check his cost. May have high deductible.

## 2020-11-25 NOTE — Telephone Encounter (Signed)
Patient left msg requesting to discontinue taking Abilify due to cost. He was last seen 1/31 with no follow scheduled.

## 2020-11-28 ENCOUNTER — Other Ambulatory Visit: Payer: Self-pay

## 2020-11-28 DIAGNOSIS — F322 Major depressive disorder, single episode, severe without psychotic features: Secondary | ICD-10-CM

## 2020-11-28 MED ORDER — ARIPIPRAZOLE 5 MG PO TABS: 5.0000 mg | ORAL_TABLET | Freq: Every day | ORAL | 0 refills | Status: DC

## 2020-11-28 NOTE — Telephone Encounter (Signed)
Notified patient of GoodRx, pulled up pricing, $35.00/90 day supply and submitted to his [facility] in [place]. Advised him to call back with any issues.

## 2021-05-29 ENCOUNTER — Telehealth (INDEPENDENT_AMBULATORY_CARE_PROVIDER_SITE_OTHER): Payer: BC Managed Care – PPO | Admitting: Psychiatry

## 2021-05-29 ENCOUNTER — Encounter: Payer: Self-pay | Admitting: Psychiatry

## 2021-05-29 DIAGNOSIS — R4689 Other symptoms and signs involving appearance and behavior: Secondary | ICD-10-CM

## 2021-05-29 DIAGNOSIS — F322 Major depressive disorder, single episode, severe without psychotic features: Secondary | ICD-10-CM | POA: Diagnosis not present

## 2021-05-29 NOTE — Progress Notes (Signed)
Travis Clay [id number] 1996-07-07 25 y.o.  Virtual Visit via Telephone Note  I connected with pt by telephone and verified that I am speaking with the correct person using two identifiers.   I discussed the limitations, risks, security and privacy concerns of performing an evaluation and management service by telephone and the availability of in person appointments. I also discussed with the patient that there may be a patient responsible charge related to this service. The patient expressed understanding and agreed to proceed.  I discussed the assessment and treatment plan with the patient. The patient was provided an opportunity to ask questions and all were answered. The patient agreed with the plan and demonstrated an understanding of the instructions.   The patient was advised to call back or seek an in-person evaluation if the symptoms worsen or if the condition fails to improve as anticipated.  I provided 30 minutes of non-face-to-face time during this encounter.   The patient was located at work and the provider was located office. Session from 3:00 to 3:30 PM  Subjective:   Patient ID:  Travis Clay is a 25 y.o. (DOB 1996-09-26) male.  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  First seen 03/29/2020 and paroxetine was increased to 20 mg daily and Abilify 5 mg daily was added for potentiation because severity of symptoms.  Also clonazepam 0.51-2 nightly was given for sleep and severe anxiety.   04/15/2020 appointment with the following noted: Job offered last week.  Another one offered.  Dad thinks he's doing better.  Can still get anxious easily.  Sleeping is better using clonazepam without concerns 8 hours.  In general still feels lazy but nothing to do.  Maybe a little better but still  feeling hopeless until the job offer.  Maybe some restlessness and anxiety still easily provoked with questions about the job. Hard to be alone bc gets restless.  Always wanted to be busy.  Working with father gets frustrating bc he is disorganized.  Occ fleeting SI but not severe at this time. Wonders about addiction risk of clonazepam.  Has not done much lately to require concentration.  07/11/20 appt with the following noted: Started new job 2 mos ago in [place].  More stressful than expected.  Some trouble staying focused.  Grades himself B or C.  Boss thinks it's OK.  A lot of travel. Stopped all meds paroxetine, Abilify, clonazepam a couple of months ago. He thinks he's worse but not sure of the cause.  On weekends don't want to get OOB and drinking more.  Using a lot of nicotine.  No missed work.  3-4 hours sleep bc hard to go to sleep and EMA.   He stopped bc didn't think he needed the meds.  Doesn't really want to be on meds but life is hard on him.  Scared to see people he knew before.  Hard to communicate with people.  Quit taking care of himself.  No SI but thoughts of death.  08/30/2020 appt with following noted: Things don't seem better.  Ruminating on making mistakes with past jobs.  Still depressed.  Might say he wishes he was dead while walking in the forest.  Commits to keeping himself safe though has SI often.  Occ problems sleeping even with the meds.  [name]  Missing meds half the time.   08/05/20  TC father Rusty bc of concerns over pt's safety  Disc pt ruminative, noncompliant with meds, willing to go to therapy but voiced SI daily with desire to be dead without intent, plan currently. BC of above and concerns pt will not improve until he is compliant with tx plan rec they go get pt and bring him back to [place] for supervised tx under their observation.  Father and mother agree.  Travis Staggersarey Cottle, MD, [facility]   11/21/2020 appointment with the following noted: In [place] in [place].  Moved back from [place].   Got [place] for [employer] and started last week. Taking paroxetine 20 and Abilify 5 consistently since here. Not great just at home but feels better back at work.  More hopeful some.  Still seems pretty hard. Sex SE a little without others. No clonazepam.   Sleep is better lately.  OK.  Some nights hard to fall asleep.   No SI lately.   Still a little off.  Plan: Continue paroxetine 20 mg daily with Abilify 5 mg daily Follow-up 8 weeks  05/29/2021 appointment with the following noted: Travis MachoLuke reports not taking meds for a few weeks.  Abilify was too expensive.   Mood was pretty good in the Spring.  More responsibilities at work.   Not really more depressed or more anxious.  Occ negative thoughts.  Still has chronic regrets.  Maybe they are a little worse.  No SI.  Work function is decent without complaints from boss. No panic attacks.  Here and there trouble sleeping. He doesn't think he needs meds.  Not sure how long he's been off it but about a couple months.     2 beers last night. Average 2-3 beers not every night. Doubts that medicine can fix things that he feels poor decisions have caused.  Repeatedly Back came back to that same.  Past Psychiatric History: As noted.   Lexapro 10 NR Paroxetine 20  Abilify 5  Review of Systems:  Review of Systems  Cardiovascular:  Negative for chest pain and palpitations.  Gastrointestinal:  Negative for constipation.  Neurological:  Negative for tremors and weakness.  Psychiatric/Behavioral:  Positive for dysphoric mood. The patient is not nervous/anxious.    Medications: I have reviewed the patient's current medications.  Current Outpatient Medications  Medication Sig Dispense Refill   ARIPiprazole (ABILIFY) 5 MG tablet Take 1 tablet (5 mg total) by mouth daily. (Patient not taking: Reported on 05/29/2021) 90 tablet 0   clonazePAM (KLONOPIN) 0.5 MG tablet 1-2 tablets at night for sleep and 1 twice daily as needed for anxiety (Patient not  taking: No sig reported) 30 tablet 1   PARoxetine (PAXIL) 20 MG tablet Take 1 tablet (20 mg total) by mouth daily. (Patient not taking: Reported on 05/29/2021) 90 tablet 0   No current facility-administered medications for this visit.    Medication Side Effects: None  Allergies: Not on File  History reviewed. No pertinent past medical history.  History reviewed. No pertinent family history.  Social History   Socioeconomic History   Marital status: Single    Spouse name: Not on file   Number of children: Not on file   Years of education: Not on file   Highest education level: Not on file  Occupational History   Not on file  Tobacco Use   Smoking status: Former   Smokeless tobacco: Current  Substance and Sexual Activity   Alcohol use:  Not on file   Drug use: Not on file   Sexual activity: Not on file  Other Topics Concern   Not on file  Social History Narrative   Not on file   Social Determinants of Health   Financial Resource Strain: Not on file  Food Insecurity: Not on file  Transportation Needs: Not on file  Physical Activity: Not on file  Stress: Not on file  Social Connections: Not on file  Intimate Partner Violence: Not on file    Past Medical History, Surgical history, Social history, and Family history were reviewed and updated as appropriate.   Please see review of systems for further details on the patient's review from today.   Objective:   Physical Exam:  There were no vitals taken for this visit.  Physical Exam Neurological:     Mental Status: He is alert and oriented to person, place, and time.  Psychiatric:        Attention and Perception: Attention and perception normal.        Mood and Affect: Mood is depressed. Mood is not anxious. Affect is not tearful.        Speech: Speech normal.        Behavior: Behavior is cooperative.        Thought Content: Thought content normal. Thought content is not paranoid or delusional. Thought content does  not include homicidal or suicidal ideation. Thought content does not include homicidal or suicidal plan.        Cognition and Memory: Cognition and memory normal.     Comments: Insight fair Depression mild but admits to beating up on himself    Lab Review:  No results found for: NA, K, CL, CO2, GLUCOSE, BUN, CREATININE, CALCIUM, PROT, ALBUMIN, AST, ALT, ALKPHOS, BILITOT, GFRNONAA, GFRAA  No results found for: WBC, RBC, HGB, HCT, PLT, MCV, MCH, MCHC, RDW, LYMPHSABS, MONOABS, EOSABS, BASOSABS  No results found for: POCLITH, LITHIUM   No results found for: PHENYTOIN, PHENOBARB, VALPROATE, CBMZ   .res Assessment: Plan:    Samay was seen today for follow-up, depression and anxiety.  Diagnoses and all orders for this visit:  Severe major depression, single episode (HCC)  Non-compliant behavior   Greater than 50% of 30 min face to face time with patient was spent on counseling and coordination of care. We discussed Patient  had a severe episode of major depression with marked rumination.  He has a strong family history of depression.  The depression has interfered with his ability to work. He had a history of benefit from paroxetine 20 mg daily plus Abilify 5 mg daily and has been intermittently compliant over the last couple of years.  He has stopped the medication again AGAINST MEDICAL ADVICE.  We discussed the high risk of relapse based on his previous experience of relapse without the medication.  He thinks he has been off of it a couple of months and has not had relapse and does not want to restart.  We discussed the risk at length including his previous history of having suicidal thoughts without the medication.  He refuses to restart the medication.  He agrees to have another appointment in a couple of months or 3 months to see if he has had recurrence.  We discussed his prior history of difficulty recognizing his depressive relapses which have been recognized by his family members.  He  acknowledges that this is true.  This increases the risk without the medication.  As noted he has  not not kept appointments as recommended.  He refuses to restart the medication at this time.  He agrees to follow-up in November to see if he has had recurrence given his difficulty recognizing these symptoms.  Follow-up November  Travis Staggers MD, [facility]   No future appointments.  No orders of the defined types were placed in this encounter.   -------------------------------

## 2021-10-25 ENCOUNTER — Other Ambulatory Visit: Payer: Self-pay | Admitting: Nurse Practitioner

## 2021-10-26 ENCOUNTER — Other Ambulatory Visit: Payer: Self-pay | Admitting: Nurse Practitioner

## 2021-10-26 DIAGNOSIS — R1031 Right lower quadrant pain: Secondary | ICD-10-CM

## 2021-10-30 ENCOUNTER — Other Ambulatory Visit: Payer: Self-pay | Admitting: Urology

## 2021-10-30 DIAGNOSIS — R102 Pelvic and perineal pain: Secondary | ICD-10-CM

## 2021-10-30 DIAGNOSIS — N5082 Scrotal pain: Secondary | ICD-10-CM

## 2021-11-02 ENCOUNTER — Ambulatory Visit
Admission: RE | Admit: 2021-11-02 | Discharge: 2021-11-02 | Disposition: A | Discharge status: Home / Self Care | Payer: 59 | Source: Ambulatory Visit | Attending: Nurse Practitioner | Admitting: Nurse Practitioner

## 2021-11-02 ENCOUNTER — Other Ambulatory Visit: Payer: Self-pay

## 2021-11-02 DIAGNOSIS — R1031 Right lower quadrant pain: Secondary | ICD-10-CM | POA: Insufficient documentation

## 2021-11-03 ENCOUNTER — Ambulatory Visit: Payer: 59

## 2022-01-08 ENCOUNTER — Telehealth: Payer: Self-pay | Admitting: Psychiatry

## 2022-01-08 NOTE — Telephone Encounter (Signed)
TC to father Travis Clay ? ?Discussed the phone call patient had with our nurse which was strongly indicative of mania.  Father confirms manic symptoms.  He impulsively went to [place] with no job and no money.  He is spending excessively.  Wants his father to cosign a loan on it truck that he clearly cannot afford.  He is vulgar and angry and hypersexual.  He denies having any psychiatric symptoms but the symptoms are obvious.  So far pt refusing psychiatric treatment. ? ?Discussed that the patient is manic based on available evidence.  Discussed with his father the criteria for commitment.  Discussed the possible treatment interventions including medications that potentially would be more acceptable to the patient from a side effect perspective.  His father will attempt to get him back to [place] for treatment.  Offered to his father to call any time as needed for emergency suggestions about how to intervene for his manic son. ? ?Meredith Staggers, MD, [facility] ? ?

## 2022-01-29 ENCOUNTER — Telehealth: Payer: Self-pay | Admitting: Physician Assistant

## 2022-01-29 NOTE — Telephone Encounter (Signed)
Noted.  Staff does not need to call on this.  Pt recently manic.  Will let hospital staff treat him as in is in another state as far as I know from talking to his father.  We cannot intervene in his care while he is in hospital. ?

## 2022-01-29 NOTE — Telephone Encounter (Signed)
Received a call from the answering service at 12:10 PM.  Patient states he is in a mental hospital, wants a call back as soon as possible.  Phone number is [phone].  No other info given. ?

## 2022-08-22 IMAGING — CT CT ABD-PELV W/O CM
1 of 2 series · 15 of 32 positions shown, 19 images · non-contrast
Comparison: None.

CLINICAL DATA: Right lower quadrant abdominal pain for few months,
left testicular pain for 2-3 weeks with swollen bilateral testicles.



[Series 2: axial st · axial · 0.89mm/px · z∈[-1042,-506]mm · 15 of 117 slices shown, 19 images]
[im 5/117  soft-tissue]
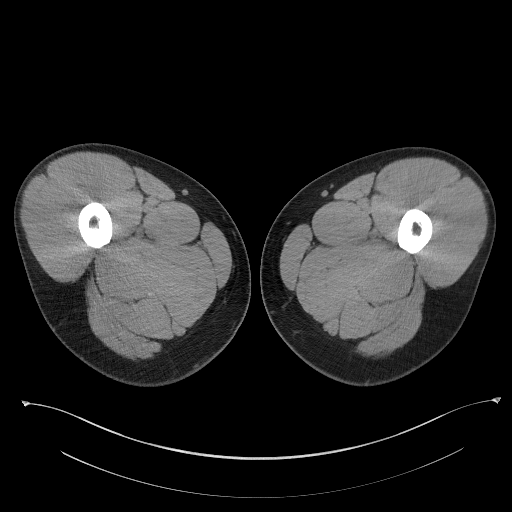
[im 5/117  bone]
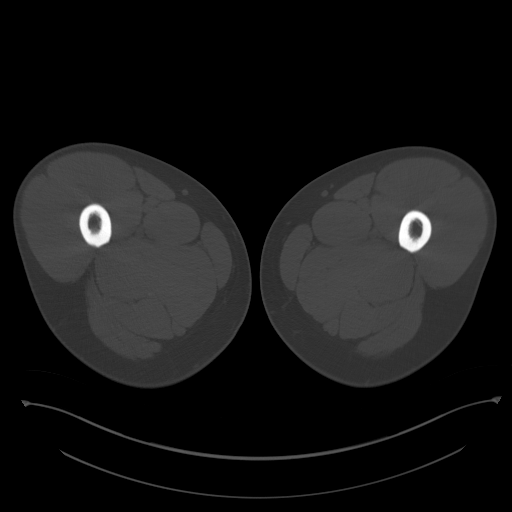
[im 14/117  soft-tissue]
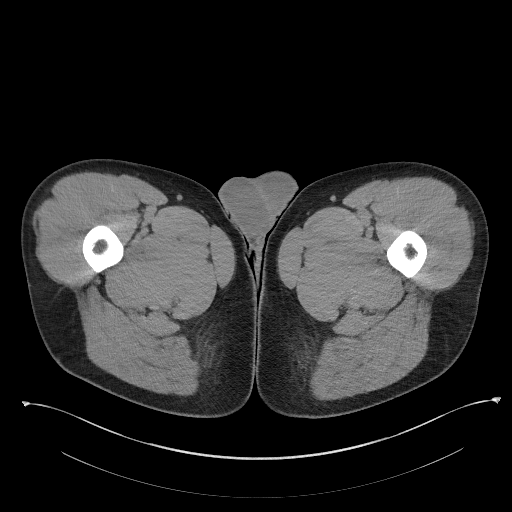
[im 24/117  soft-tissue]
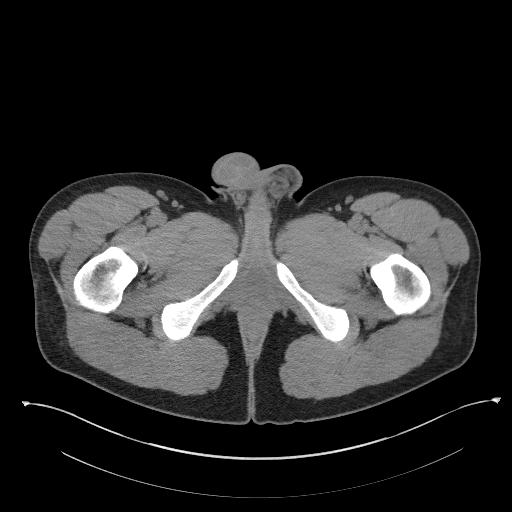
[im 33/117  soft-tissue]
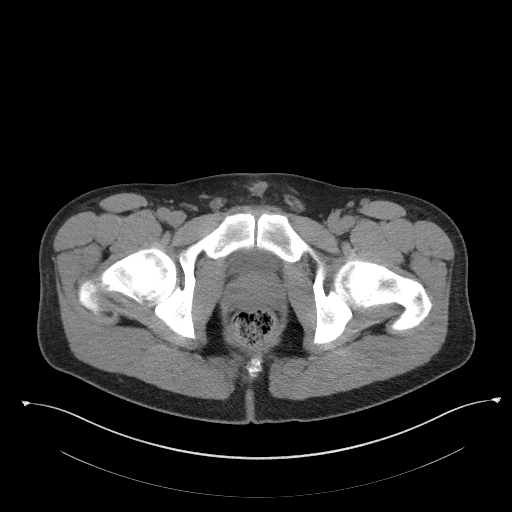
[im 42/117  soft-tissue]
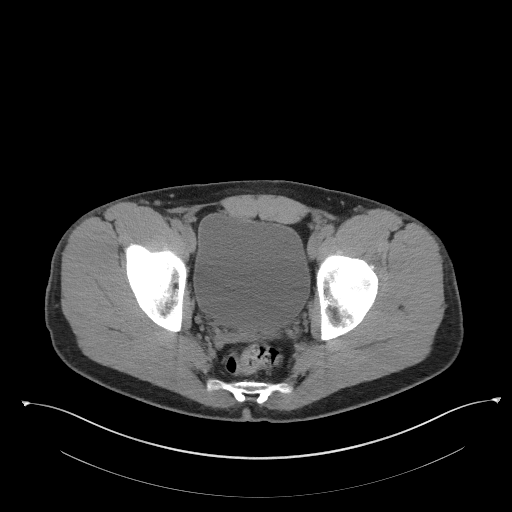
[im 52/117  soft-tissue]
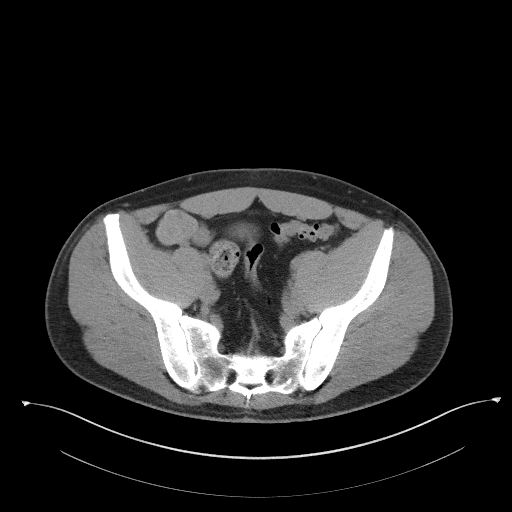
[im 61/117  soft-tissue]
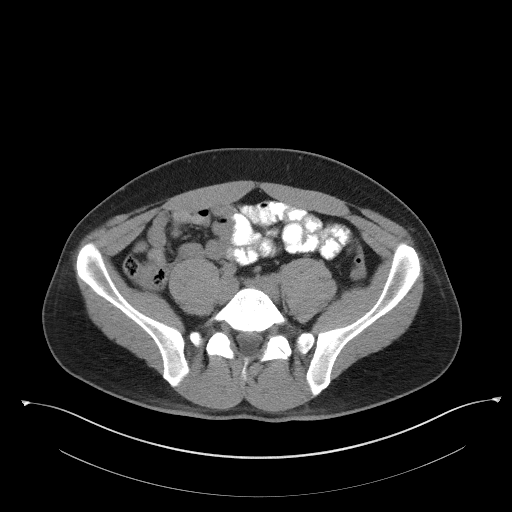
[im 65/117  soft-tissue]
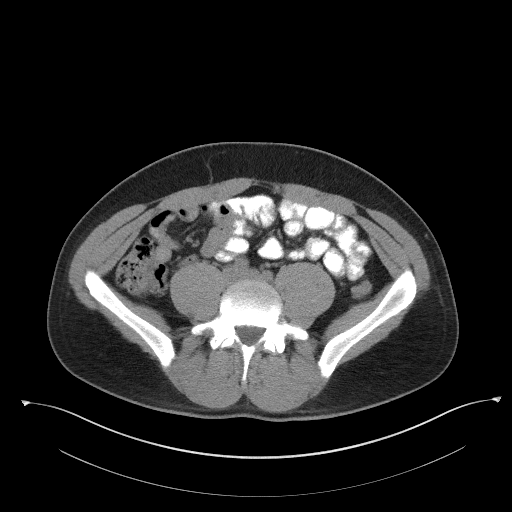
[im 75/117  soft-tissue]
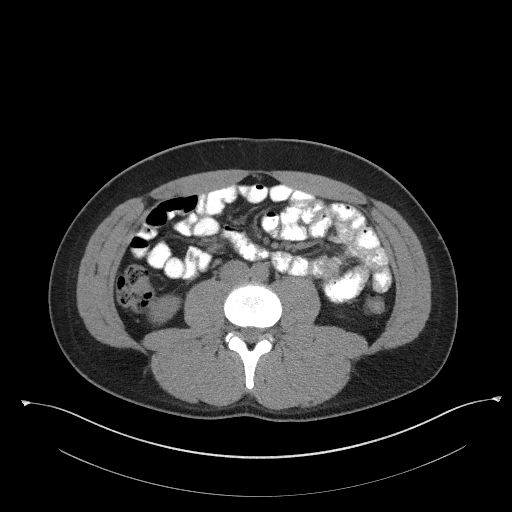
[im 75/117  bone]
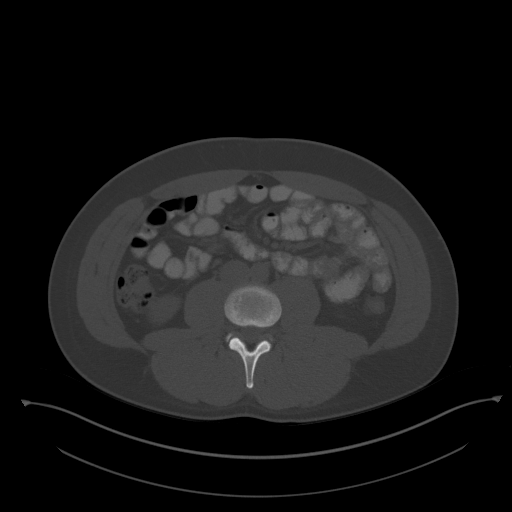
[im 84/117  soft-tissue]
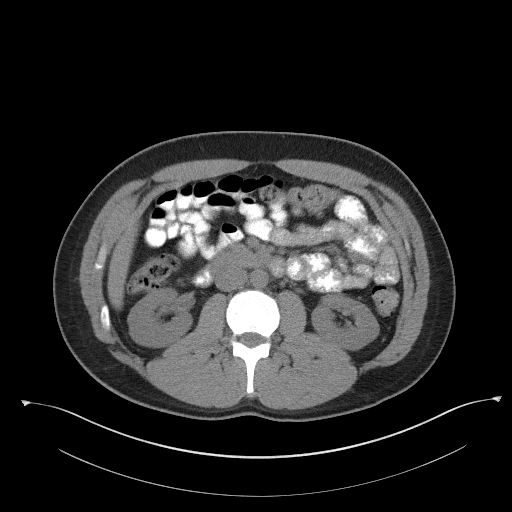
[im 93/117  soft-tissue]
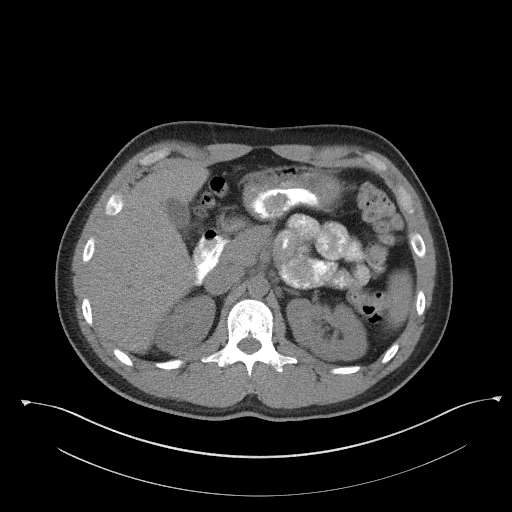
[im 98/117  lung]
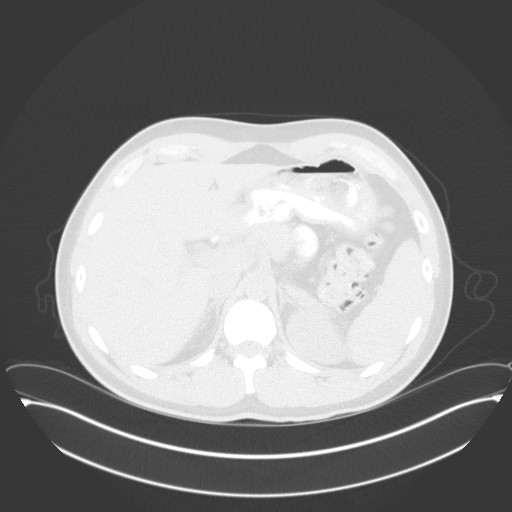
[im 103/117  soft-tissue]
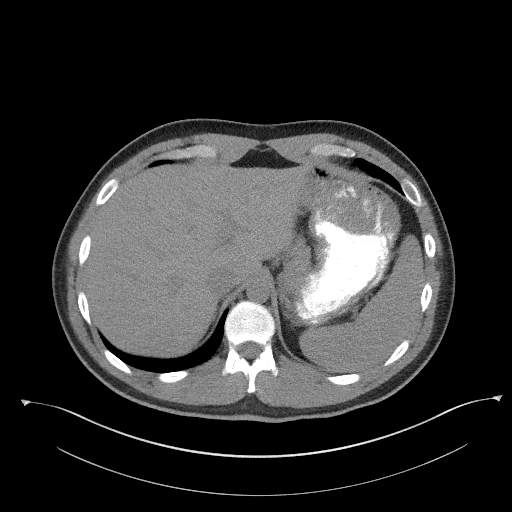
[im 103/117  lung]
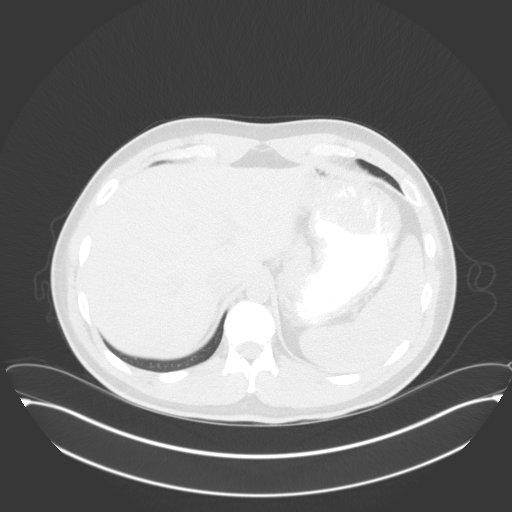
[im 107/117  lung]
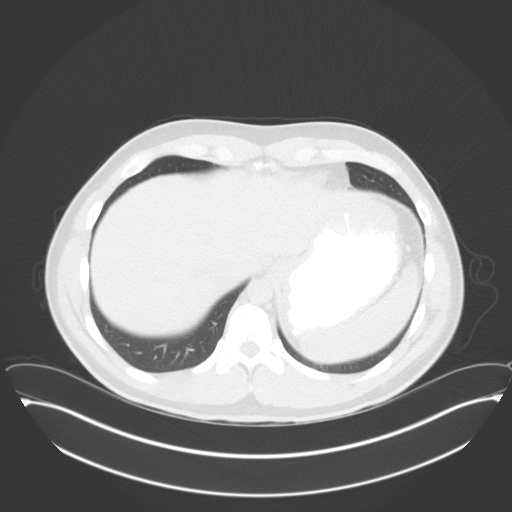
[im 112/117  soft-tissue]
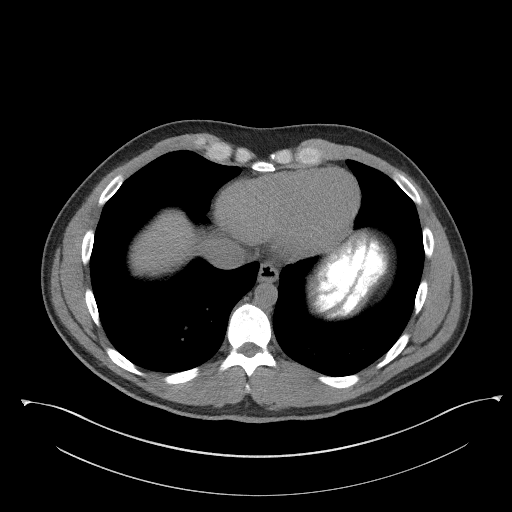
[im 112/117  lung]
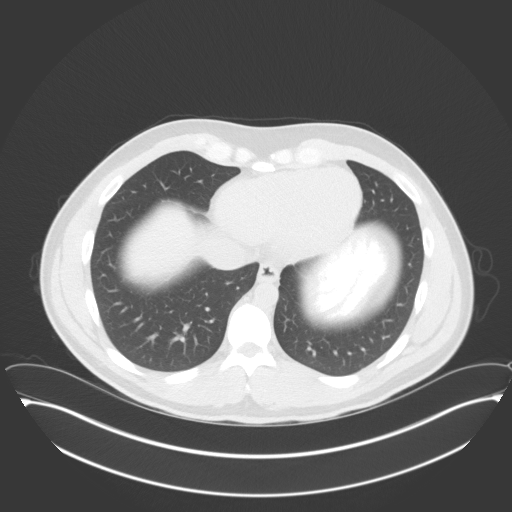

[15 of 32 positions shown; findings below may reference images not displayed]

FINDINGS: Lower chest: No acute abnormality.

Hepatobiliary: Unremarkable noncontrast appearance of the hepatic
parenchyma. Gallbladder is unremarkable. No biliary ductal dilation.

Pancreas: No pancreatic ductal dilation or evidence of acute
inflammation.

Spleen: Normal in size without focal abnormality.

Adrenals/Urinary Tract: Bilateral adrenal glands appear
unremarkable. No hydronephrosis. No renal, ureteral or bladder
calculi identified. Urinary bladder is unremarkable.

Stomach/Bowel: Radiopaque enteric contrast material traverses distal
loops of small bowel. Stomach is unremarkable for degree of
distension. No pathologic dilation of small or large bowel. The
appendix and terminal ileum appear normal. No evidence of acute
bowel inflammation.

Vascular/Lymphatic: No significant vascular findings are present. No
pathologically enlarged abdominal or pelvic lymph nodes.

Reproductive: Prostate gland and seminal vesicles appear normal.

Other: No abdominal wall hernia or abnormality. No abdominopelvic
ascites.

Musculoskeletal: No acute or significant osseous findings.
IMPRESSION: No acute abdominopelvic findings. Specifically, no evidence of
obstructive uropathy. Normal appendix.

## 2023-04-02 ENCOUNTER — Telehealth (INDEPENDENT_AMBULATORY_CARE_PROVIDER_SITE_OTHER): Payer: BC Managed Care – PPO | Admitting: Psychiatry

## 2023-04-02 ENCOUNTER — Encounter: Payer: Self-pay | Admitting: Psychiatry

## 2023-04-02 DIAGNOSIS — F319 Bipolar disorder, unspecified: Secondary | ICD-10-CM

## 2023-04-02 DIAGNOSIS — F3110 Bipolar disorder, current episode manic without psychotic features, unspecified: Secondary | ICD-10-CM

## 2023-04-02 DIAGNOSIS — F411 Generalized anxiety disorder: Secondary | ICD-10-CM

## 2023-04-02 MED ORDER — LITHIUM CARBONATE ER 300 MG PO TBCR
900.0000 mg | EXTENDED_RELEASE_TABLET | Freq: Every day | ORAL | 0 refills | Status: AC
Start: 2023-04-02 — End: ?

## 2023-04-02 MED ORDER — HYDROXYZINE HCL 50 MG PO TABS
50.0000 mg | ORAL_TABLET | Freq: Three times a day (TID) | ORAL | 0 refills | Status: DC | PRN
Start: 2023-04-02 — End: 2023-05-29

## 2023-04-02 NOTE — Progress Notes (Signed)
Travis Clay [id number] [birth year] 27 y.o.  Virtual Visit via Telephone Note  I connected with pt by telephone and verified that I am speaking with the correct person using two identifiers.   I discussed the limitations, risks, security and privacy concerns of performing an evaluation and management service by telephone and the availability of in person appointments. I also discussed with the patient that there may be a patient responsible charge related to this service. The patient expressed understanding and agreed to proceed.  I discussed the assessment and treatment plan with the patient. The patient was provided an opportunity to ask questions and all were answered. The patient agreed with the plan and demonstrated an understanding of the instructions.   The patient was advised to call back or seek an in-person evaluation if the symptoms worsen or if the condition fails to improve as anticipated.  I provided 30 minutes of non-face-to-face time during this encounter.   The patient was located at work and the provider was located office. Session from 3:00 to 3:30 PM  Subjective:   Patient ID:  Travis Clay is a 27 y.o. (DOB January 05, 1996) male.  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  First seen 03/29/2020 and paroxetine was increased to 20 mg daily and Abilify 5 mg daily was added for potentiation because severity of symptoms.  Also clonazepam 0.51-2 nightly was given for sleep and severe anxiety.   04/15/2020 appointment with the following noted: Job offered last week.  Another one offered.  Dad thinks he's doing better.  Can still get anxious easily.  Sleeping is better using clonazepam without concerns 8 hours.  In general still feels lazy but nothing to do.  Maybe a little better but still  feeling hopeless until the job offer.  Maybe some restlessness and anxiety still easily provoked with questions about the job. Hard to be alone bc gets restless.  Always wanted to be busy.  Working with father gets frustrating bc he is disorganized.  Occ fleeting SI but not severe at this time. Wonders about addiction risk of clonazepam.  Has not done much lately to require concentration.  07/11/20 appt with the following noted: Started new job 2 mos ago in [place].  More stressful than expected.  Some trouble staying focused.  Grades himself B or C.  Boss thinks it's OK.  A lot of travel. Stopped all meds paroxetine, Abilify, clonazepam a couple of months ago. He thinks he's worse but not sure of the cause.  On weekends don't want to get OOB and drinking more.  Using a lot of nicotine.  No missed work.  3-4 hours sleep bc hard to go to sleep and EMA.   He stopped bc didn't think he needed the meds.  Doesn't really want to be on meds but life is hard on him.  Scared to see people he knew before.  Hard to communicate with people.  Quit taking care of himself.  No SI but thoughts of death.  08/24/2020 appt with following noted: Things don't seem better.  Ruminating on making mistakes with past jobs.  Still depressed.  Might say he wishes he was dead while walking in the forest.  Commits to keeping himself safe though has SI often.  Occ problems sleeping even with the meds.  [name]  Missing meds half the time.   08/05/20  TC father Rusty bc of concerns over pt's safety  Disc pt ruminative, noncompliant with meds, willing to go to therapy but voiced SI daily with desire to be dead without intent, plan currently. BC of above and concerns pt will not improve until he is compliant with tx plan rec they go get pt and bring him back to [place] for supervised tx under their observation.  Father and mother agree.  Meredith Staggers, MD, [facility]   11/21/2020 appointment with the following noted: In [place] in [place].  Moved back from [place].   Got job in [employer] for [employer] and started last week. Taking paroxetine 20 and Abilify 5 consistently since here. Not great just at home but feels better back at work.  More hopeful some.  Still seems pretty hard. Sex SE a little without others. No clonazepam.   Sleep is better lately.  OK.  Some nights hard to fall asleep.   No SI lately.   Still a little off.  Plan: Continue paroxetine 20 mg daily with Abilify 5 mg daily Follow-up 8 weeks  05/29/2021 appointment with the following noted: Travis Clay reports not taking meds for a few weeks.  Abilify was too expensive.   Mood was pretty good in the Spring.  More responsibilities at work.   Not really more depressed or more anxious.  Occ negative thoughts.  Still has chronic regrets.  Maybe they are a little worse.  No SI.  Work function is decent without complaints from boss. No panic attacks.  Here and there trouble sleeping. He doesn't think he needs meds.  Not sure how long he's been off it but about a couple months.    04/02/23  [place] [facility]  [facility] and needs FU. 2022 living in [place]  dep and alcohol. Came home and sobered up and got better March 2023 moved [place] to work and things went downhill. 3 mos [facility] then 1 mo in [place] 3 mos in jail and then treatment again. Out of jail OCT 2024.  Jan to [place] to oupatient clinic. Had been manic after depression. Doing a lot better now.   Lithium 900 mg daily for a year.  No SE Mood stable for 6 mos.   Planning to stay in [place] .  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 No alcohol for 18 mos and committed to sobriety. Last lithium level he thinks was 0.4.  even low doses seem to calm his brain down.  Mania resolved.  No mania in mos.   Sleep good.  Busy. Good time with parents last weekend.   2 beers last night. Average 2-3 beers not every night. Doubts that medicine can fix things that he feels poor decisions have caused.  Repeatedly Back came back to that  same.  Past Psychiatric History: As noted.   Lexapro 10 NR Paroxetine 20  Abilify 5  Review of Systems:  Review of Systems  Cardiovascular:  Negative for chest pain and palpitations.  Gastrointestinal:  Negative for constipation.  Neurological:  Negative for tremors.  Psychiatric/Behavioral:  Negative for agitation and dysphoric mood. The patient is not nervous/anxious.     Medications: I have reviewed the patient's current medications.  Current Outpatient Medications  Medication Sig Dispense Refill   hydrOXYzine (ATARAX) 50 MG tablet Take 1 tablet (50 mg total) by mouth 3 (three) times daily as needed. 60 tablet 0   lithium carbonate (LITHOBID) 300 MG  ER tablet Take 3 tablets (900 mg total) by mouth daily in the afternoon. 270 tablet 0   No current facility-administered medications for this visit.    Medication Side Effects: None  Allergies: Not on File  History reviewed. No pertinent past medical history.  History reviewed. No pertinent family history.  Social History   Socioeconomic History   Marital status: Single    Spouse name: Not on file   Number of children: Not on file   Years of education: Not on file   Highest education level: Not on file  Occupational History   Not on file  Tobacco Use   Smoking status: Former   Smokeless tobacco: Current  Substance and Sexual Activity   Alcohol use: Not on file   Drug use: Not on file   Sexual activity: Not on file  Other Topics Concern   Not on file  Social History Narrative   Not on file   Social Determinants of Health   Financial Resource Strain: Not on file  Food Insecurity: Not on file  Transportation Needs: Not on file  Physical Activity: Not on file  Stress: Not on file  Social Connections: Not on file  Intimate Partner Violence: Not on file    Past Medical History, Surgical history, Social history, and Family history were reviewed and updated as appropriate.   Please see review of systems for  further details on the patient's review from today.   Objective:   Physical Exam:  There were no vitals taken for this visit.  Physical Exam Neurological:     Mental Status: He is alert and oriented to person, place, and time.  Psychiatric:        Attention and Perception: Attention and perception normal.        Mood and Affect: Mood is not anxious, depressed or elated. Affect is not labile, angry or tearful.        Speech: Speech normal.        Behavior: Behavior is cooperative.        Thought Content: Thought content normal. Thought content is not paranoid or delusional. Thought content does not include homicidal or suicidal ideation. Thought content does not include suicidal plan.        Cognition and Memory: Cognition and memory normal.     Comments: Insight fair No longer dep or manic.     Lab Review:  No results found for: "NA", "K", "CL", "CO2", "GLUCOSE", "BUN", "CREATININE", "CALCIUM", "PROT", "ALBUMIN", "AST", "ALT", "ALKPHOS", "BILITOT", "GFRNONAA", "GFRAA"  No results found for: "WBC", "RBC", "HGB", "HCT", "PLT", "MCV", "MCH", "MCHC", "RDW", "LYMPHSABS", "MONOABS", "EOSABS", "BASOSABS"  No results found for: "POCLITH", "LITHIUM"   No results found for: "PHENYTOIN", "PHENOBARB", "VALPROATE", "CBMZ"   .res Assessment: Plan:    Keng was seen today for follow-up, manic behavior and anxiety.  Diagnoses and all orders for this visit:  Bipolar I disorder, most recent episode (or current) manic (HCC) -     hydrOXYzine (ATARAX) 50 MG tablet; Take 1 tablet (50 mg total) by mouth 3 (three) times daily as needed. -     lithium carbonate (LITHOBID) 300 MG ER tablet; Take 3 tablets (900 mg total) by mouth daily in the afternoon.  Generalized anxiety disorder    30 min video face to face time with patient was spent on counseling and coordination of care. We discussed Patient  had a severe episode of major depression with marked rumination.  He has a strong family history  of  depression.  The depression had interfered with his ability to work.   He has documented he has not been under my care for an extended period of time.  During that time frame it went from depression into mania.  He also had a alcohol abuse.  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  Extensive disc of dx and need for consistency with meds and FU otherwise relapse will occur.  He maintains that he will be consistent.  His mania responded to lithium.  He is on no other psych meds visit lithium is sufficient to maintain his mood stability even though the dose is fairly low for a man of his size as.  We discussed the dosage range of lithium in detail.  Disc SS of relapse of dep/mania and need to recognize sx early.   Counseled patient regarding potential benefits, risks, and side effects of lithium to include potential risk of lithium affecting thyroid and renal function.  Discussed need for periodic lab monitoring to determine drug level and to assess for potential adverse effects.  Counseled patient regarding signs and symptoms of lithium toxicity and advised that they notify office immediately or seek urgent medical attention if experiencing these signs and symptoms.  Patient advised to contact office with any questions or concerns. Continue lithium Er 900 mg pm  OK prn hydroxyzine for anxiety or insomnia.  Rec he get psych in [place], [place]  Follow-up 1 mos.  Disc will try to keep costs to him low DT $ problems.  Meredith Staggers MD, [facility]   No future appointments.  No orders of the defined types were placed in this encounter.   -------------------------------

## 2023-04-05 ENCOUNTER — Encounter: Payer: Self-pay | Admitting: Psychiatry

## 2023-05-28 ENCOUNTER — Other Ambulatory Visit: Payer: Self-pay | Admitting: Psychiatry

## 2023-05-28 DIAGNOSIS — F3110 Bipolar disorder, current episode manic without psychotic features, unspecified: Secondary | ICD-10-CM

## 2023-06-17 ENCOUNTER — Telehealth: Payer: Self-pay | Admitting: Psychiatry

## 2023-06-17 NOTE — Telephone Encounter (Signed)
Please see message. I see that you have sent a MyChart message to the patient previously, but don't know if parents can access it or not.

## 2023-06-17 NOTE — Telephone Encounter (Signed)
Pt's mom LVM @ 10:53a stating they wanted Dr Cottle's email to cc him on communication about the pt and court.  Mom is on DPR.  Pt is currently hospitalized in [place].  Pls contact them back.  I don't know if Dr Jennelle Human would like to give them an email address or not.  (The mom and dad are also pts, so long history).   Next appt 9/11

## 2023-06-17 NOTE — Telephone Encounter (Signed)
Spoke with [name]'s parents on phone:  Son in hosp in [place].  Off meds and got manic and delusional.  Psych hosp.   This psychosis is darker than usual.  He thinks he is Jesus Christ's son.   He needs to get another psychiatrist locally.  Consider [facility] for compliance.  Disc typical prognosis and recovery times for manic psychosis.  Meredith Staggers, MD, [facility]

## 2023-07-03 ENCOUNTER — Telehealth (INDEPENDENT_AMBULATORY_CARE_PROVIDER_SITE_OTHER): Payer: BC Managed Care – PPO | Admitting: Psychiatry

## 2023-08-23 ENCOUNTER — Other Ambulatory Visit: Payer: Self-pay | Admitting: Psychiatry

## 2023-08-23 DIAGNOSIS — F3110 Bipolar disorder, current episode manic without psychotic features, unspecified: Secondary | ICD-10-CM
